# Patient Record
Sex: Male | Born: 1975
Health system: Southern US, Community
[De-identification: ages and names within clinical notes are randomized; demographics above are authoritative.]

## PROBLEM LIST (undated history)

## (undated) DIAGNOSIS — Q613 Polycystic kidney, unspecified: Secondary | ICD-10-CM

## (undated) DIAGNOSIS — I10 Essential (primary) hypertension: Secondary | ICD-10-CM

---

## 2016-07-14 ENCOUNTER — Ambulatory Visit: Payer: Self-pay | Admitting: Allergy

## 2016-07-15 ENCOUNTER — Other Ambulatory Visit: Payer: Self-pay

## 2016-07-15 ENCOUNTER — Emergency Department (HOSPITAL_COMMUNITY): Payer: BLUE CROSS/BLUE SHIELD

## 2016-07-15 ENCOUNTER — Emergency Department (HOSPITAL_COMMUNITY)
Admission: EM | Admit: 2016-07-15 | Discharge: 2016-07-15 | Disposition: A | Discharge status: Home / Self Care | Payer: BLUE CROSS/BLUE SHIELD | Attending: Emergency Medicine | Admitting: Emergency Medicine

## 2016-07-15 ENCOUNTER — Encounter (HOSPITAL_COMMUNITY): Payer: Self-pay | Admitting: Nurse Practitioner

## 2016-07-15 ENCOUNTER — Ambulatory Visit (HOSPITAL_COMMUNITY)
Admission: EM | Admit: 2016-07-15 | Discharge: 2016-07-15 | Discharge status: Absent without leave | Payer: BLUE CROSS/BLUE SHIELD

## 2016-07-15 DIAGNOSIS — R0789 Other chest pain: Secondary | ICD-10-CM | POA: Diagnosis present

## 2016-07-15 DIAGNOSIS — R519 Headache, unspecified: Secondary | ICD-10-CM

## 2016-07-15 DIAGNOSIS — R51 Headache: Secondary | ICD-10-CM | POA: Insufficient documentation

## 2016-07-15 DIAGNOSIS — I10 Essential (primary) hypertension: Secondary | ICD-10-CM | POA: Insufficient documentation

## 2016-07-15 DIAGNOSIS — R062 Wheezing: Secondary | ICD-10-CM

## 2016-07-15 HISTORY — DX: Polycystic kidney, unspecified: Q61.3

## 2016-07-15 HISTORY — DX: Essential (primary) hypertension: I10

## 2016-07-15 LAB — CBC
HEMATOCRIT: 43.8 % (ref 39.0–52.0)
HEMOGLOBIN: 15.5 g/dL (ref 13.0–17.0)
MCH: 30.8 pg (ref 26.0–34.0)
MCHC: 35.4 g/dL (ref 30.0–36.0)
MCV: 86.9 fL (ref 78.0–100.0)
Platelets: 176 10*3/uL (ref 150–400)
RBC: 5.04 MIL/uL (ref 4.22–5.81)
RDW: 12.1 % (ref 11.5–15.5)
WBC: 7.4 10*3/uL (ref 4.0–10.5)

## 2016-07-15 LAB — BASIC METABOLIC PANEL
ANION GAP: 10 (ref 5–15)
BUN: 12 mg/dL (ref 6–20)
CALCIUM: 10 mg/dL (ref 8.9–10.3)
CO2: 25 mmol/L (ref 22–32)
Chloride: 104 mmol/L (ref 101–111)
Creatinine, Ser: 0.97 mg/dL (ref 0.61–1.24)
GFR calc Af Amer: >60 mL/min (ref >60)
Glucose, Bld: 133 mg/dL — ABNORMAL HIGH (ref 65–99)
POTASSIUM: 3.4 mmol/L — AB (ref 3.5–5.1)
SODIUM: 139 mmol/L (ref 135–145)

## 2016-07-15 LAB — I-STAT TROPONIN, ED
TROPONIN I, POC: 0 ng/mL (ref 0.00–0.08)
TROPONIN I, POC: 0.01 ng/mL (ref 0.00–0.08)

## 2016-07-15 MED ORDER — GI COCKTAIL ~~LOC~~
30.0000 mL | Freq: Once | ORAL | Status: AC
  Administered 2016-07-15: 30 mL via ORAL
  Filled 2016-07-15: qty 30

## 2016-07-15 MED ORDER — AEROCHAMBER PLUS W/MASK MISC
Status: AC
  Administered 2016-07-15: 1
  Filled 2016-07-15: qty 1

## 2016-07-15 MED ORDER — ALBUTEROL SULFATE HFA 108 (90 BASE) MCG/ACT IN AERS
2.0000 | INHALATION_SPRAY | Freq: Once | RESPIRATORY_TRACT | Status: AC
  Administered 2016-07-15: 2 via RESPIRATORY_TRACT
  Filled 2016-07-15: qty 6.7

## 2016-07-15 MED ORDER — KETOROLAC TROMETHAMINE 60 MG/2ML IM SOLN
60.0000 mg | Freq: Once | INTRAMUSCULAR | Status: AC
  Administered 2016-07-15: 60 mg via INTRAMUSCULAR
  Filled 2016-07-15: qty 2

## 2016-07-15 MED ORDER — OMEPRAZOLE 20 MG PO CPDR: 20.0000 mg | DELAYED_RELEASE_CAPSULE | Freq: Every day | ORAL | 0 refills | Status: AC

## 2016-07-15 MED ORDER — ALBUTEROL SULFATE (2.5 MG/3ML) 0.083% IN NEBU
5.0000 mg | INHALATION_SOLUTION | Freq: Once | RESPIRATORY_TRACT | Status: AC
  Administered 2016-07-15: 5 mg via RESPIRATORY_TRACT
  Filled 2016-07-15: qty 6

## 2016-07-15 MED ORDER — PREDNISONE 20 MG PO TABS
60.0000 mg | ORAL_TABLET | Freq: Once | ORAL | Status: AC
  Administered 2016-07-15: 60 mg via ORAL
  Filled 2016-07-15: qty 3

## 2016-07-15 MED ORDER — AEROCHAMBER PLUS W/MASK MISC
1.0000 | Freq: Once | Status: AC
  Administered 2016-07-15: 1

## 2016-07-15 MED ORDER — METOCLOPRAMIDE HCL 10 MG PO TABS
10.0000 mg | ORAL_TABLET | Freq: Once | ORAL | Status: AC
  Administered 2016-07-15: 10 mg via ORAL
  Filled 2016-07-15: qty 1

## 2016-07-15 MED ORDER — ENALAPRIL MALEATE 10 MG PO TABS: 10.0000 mg | ORAL_TABLET | Freq: Every day | ORAL | 1 refills | Status: AC

## 2016-07-15 MED ORDER — DIPHENHYDRAMINE HCL 25 MG PO CAPS
25.0000 mg | ORAL_CAPSULE | Freq: Once | ORAL | Status: AC
  Administered 2016-07-15: 25 mg via ORAL
  Filled 2016-07-15: qty 1

## 2016-07-15 MED ORDER — IPRATROPIUM BROMIDE 0.02 % IN SOLN
0.5000 mg | Freq: Once | RESPIRATORY_TRACT | Status: AC
  Administered 2016-07-15: 0.5 mg via RESPIRATORY_TRACT
  Filled 2016-07-15: qty 2.5

## 2016-07-15 NOTE — ED Provider Notes (Signed)
[facility] Provider Note   CSN: [id number] Arrival date & time: 07/15/16  [id number]     History   Chief Complaint Chief Complaint  Patient presents with  . Chest Pain    HPI Gabriel Soto is a 40 y.o. male.  HPI   Patient is a 40 year old AAM, with PMHx of PKD and HTN, [place] the [place]) presents to the ER for evaluation of central and left-sided chest pressure that began at 5:30 PM, patient states he was walking out of the classroom when the pain began and was associated with shortness of breath.  Pain is without radiation, last 3-5 minutes at a time and recurs every 20 minutes since its onset, all sx completely resolve in between episodes and currently he reports no CP.  He had similar chest pain about 4 days ago after vomiting all day long, he was [facility] in the [facility] for this. Since that time he reports mild right posterior headache described as burning.  He states that his chest pain is similar.  He denies palpitations, lower extremity edema, near syncope, cough, fever, wheeze.  Denies recent URI symptoms.  He reports childhood asthma.  No smoking history, he denies family history of heart attack or stroke.  Past Medical History:  Diagnosis Date  . Hypertension   . Polycystic kidney disease     There are no active problems to display for this patient.   History reviewed. No pertinent surgical history.     Home Medications    Prior to Admission medications   Medication Sig Start Date End Date Taking? Authorizing Provider  enalapril (VASOTEC) 10 MG tablet Take 10 mg by mouth at bedtime.   Yes Historical Provider, MD  enalapril (VASOTEC) 10 MG tablet Take 1 tablet (10 mg total) by mouth daily. 07/15/16   Danelle BerryLeisa Manju Kulkarni, PA-C  omeprazole (PRILOSEC) 20 MG capsule Take 1 capsule (20 mg total) by mouth daily. 07/15/16   Danelle BerryLeisa Amatullah Christy, PA-C    Family History History reviewed. No pertinent family history.  Social  History Social History  Substance Use Topics  . Smoking status: Never Smoker  . Smokeless tobacco: Never Used  . Alcohol use No     Allergies   Shrimp [shellfish allergy]   Review of Systems Review of Systems  All other systems reviewed and are negative.    Physical Exam Updated Vital Signs BP 156/82   Pulse 68   Temp 98.3 F (36.8 C) (Oral)   Resp 18   Ht 5' 9.69" (1.77 m)   Wt 65 kg   SpO2 97%   BMI 20.75 kg/m   Physical Exam  Constitutional: He is oriented to person, place, and time. He appears well-developed and well-nourished. No distress.  Thin well appearing male, appears stated age, NAD  HENT:  Head: Normocephalic and atraumatic.  Right Ear: External ear normal.  Left Ear: External ear normal.  Nose: Nose normal.  Mouth/Throat: Oropharynx is clear and moist. No oropharyngeal exudate.  Eyes: Conjunctivae and EOM are normal. Pupils are equal, round, and reactive to light. Right eye exhibits no discharge. Left eye exhibits no discharge. No scleral icterus.  Neck: Normal range of motion. Neck supple. No JVD present. No tracheal deviation present.  Cardiovascular: Normal rate, regular rhythm, normal heart sounds and intact distal pulses.  Exam reveals no gallop and no friction rub.   No murmur heard. Symmetrical radial and posterior tibialis pulses, 2+, no lower extremity edema, no  JVD  Pulmonary/Chest: Effort normal. No stridor. No respiratory distress. He has wheezes. He has no rales. He exhibits no tenderness.  Expiratory wheeze, rhonchi bilaterally in mid to lower lung fields, no respiratory distress, no accessory muscle use  Abdominal: Soft. Bowel sounds are normal. He exhibits no distension and no mass. There is no tenderness. There is no rebound and no guarding.  Musculoskeletal: Normal range of motion. He exhibits no deformity.  Lymphadenopathy:    He has no cervical adenopathy.  Neurological: He is alert and oriented to person, place, and time. He has  normal reflexes. He exhibits normal muscle tone. Coordination normal.  Skin: Skin is warm and dry. Capillary refill takes less than 2 seconds. No rash noted. He is not diaphoretic. No erythema. No pallor.  Psychiatric: He has a normal mood and affect. His behavior is normal. Judgment and thought content normal.  Nursing note and vitals reviewed.    ED Treatments / Results  Labs (all labs ordered are listed, but only abnormal results are displayed) Labs Reviewed  BASIC METABOLIC PANEL - Abnormal; Notable for the following:       Result Value   Potassium 3.4 (*)    Glucose, Bld 133 (*)    All other components within normal limits  CBC  I-STAT TROPOININ, ED  I-STAT TROPOININ, ED    EKG  EKG Interpretation None       Radiology Dg Chest 2 View  Result Date: 07/15/2016 CLINICAL DATA:  Left-sided chest pain and shortness of breath EXAM: CHEST  2 VIEW COMPARISON:  None. FINDINGS: The heart size and mediastinal contours are within normal limits. Both lungs are clear. The visualized skeletal structures are unremarkable. IMPRESSION: No active cardiopulmonary disease. Electronically Signed   By: Gerome Samavid  Williams III M.D   On: 07/15/2016 19:50    Procedures Procedures (including critical care time)  Medications Ordered in ED Medications  gi cocktail (Maalox,Lidocaine,Donnatal) (30 mLs Oral Given 07/15/16 2130)  predniSONE (DELTASONE) tablet 60 mg (60 mg Oral Given 07/15/16 2127)  ketorolac (TORADOL) injection 60 mg (60 mg Intramuscular Given 07/15/16 2135)  metoCLOPramide (REGLAN) tablet 10 mg (10 mg Oral Given 07/15/16 2126)  diphenhydrAMINE (BENADRYL) capsule 25 mg (25 mg Oral Given 07/15/16 2126)  albuterol (PROVENTIL) (2.5 MG/3ML) 0.083% nebulizer solution 5 mg (5 mg Nebulization Given 07/15/16 2124)  ipratropium (ATROVENT) nebulizer solution 0.5 mg (0.5 mg Nebulization Given 07/15/16 2124)  albuterol (PROVENTIL HFA;VENTOLIN HFA) 108 (90 Base) MCG/ACT inhaler 2 puff (2 puffs  Inhalation Given 07/15/16 2226)  aerochamber plus with mask device 1 each (1 each Other Given 07/15/16 2226)     Initial Impression / Assessment and Plan / ED Course  I have reviewed the triage vital signs and the nursing notes.  Pertinent labs & imaging results that were available during my care of the patient were reviewed by me and considered in my medical decision making (see chart for details).  Clinical Course   Patient with chest pressure/tightness that began this afternoon, lasts a few minutes, associated with SOB, resolves for 20-30 minutes and then reoccurs.  He had 0 pain at the time of my evaluation but did have scattered rhonchi and expiratory wheeze.  No other associated symptoms with his pain today. He did report having profuse vomiting several days ago when he had chest discomfort at that time, it felt somewhat similar today however he had no vomiting or reflux symptoms today.  He also reports a history of asthma but he has not used an  inhaler since high school.  He is concerned that his blood pressure is elevated today he takes enalapril for hypertension and he has polycystic kidney disease, has been in the [place] for 3 months and has not established primary care provider.    His workup was negative, including negative troponin, negative chest x-ray, EKG normal sinus with likely LVH, no ischemic changes.  He is given a GI cocktail and breathing treatment with clearance of his wheeze.  He continued to have no pain and was hemodynamically stable, is due for his blood pressure medication.  Delta troponin was negative.   Discharge home with albuterol inhaler to use as needed for wheeze or chest tightness. Initiated PPI trial.  Pain was treated in the ER, headache was completely resolved, and he had no reoccurrence of chest pain.  Patient does have insurance but will need to establish primary care provider for follow-up on this chronic conditions. His pressure medication was refilled for him.   Given resources for assistance with establishing PCP.  Final Clinical Impressions(s) / ED Diagnoses   Final diagnoses:  Atypical chest pain  Nonintractable headache, unspecified chronicity pattern, unspecified headache type  Wheeze    New Prescriptions Discharge Medication List as of 07/15/2016 10:02 PM    START taking these medications   Details  !! enalapril (VASOTEC) 10 MG tablet Take 1 tablet (10 mg total) by mouth daily., Starting Fri 07/15/2016, Print    omeprazole (PRILOSEC) 20 MG capsule Take 1 capsule (20 mg total) by mouth daily., Starting Fri 07/15/2016, Print     !! - Potential duplicate medications found. Please discuss with provider.       Danelle Berry, PA-C 07/16/16 1610    Gwyneth Sprout, MD 07/19/16 2028

## 2016-07-15 NOTE — ED Triage Notes (Signed)
Pt presents with c/o chest pain. The pain began this afternoon while he was on his way home from class. He describes as a tightness in the middle of his chest. The pain has been intermittent since onset. He reports SOB, headache. He reports a recent GI bug that he was treated for at student clinic and has not felt well since.

## 2016-07-15 NOTE — ED Notes (Addendum)
Pt started to have pain in his chest at 1730 this date. Pain immediately subsided but states he began to have a headache that won't go away. Denies any N/V/D or SOB.

## 2016-07-15 NOTE — ED Notes (Signed)
"  Breathing easier, feeling better", alert, NAD, calm, interactive, resps e/u, LS CTA, no dyspnea noted, (denies: sx or pain.

## 2016-07-15 NOTE — ED Notes (Signed)
Pt vebalized understanding and teach back method of using the inhaler and chamber.

## 2016-07-15 NOTE — ED Notes (Signed)
Neb treatment completed.

## 2019-09-09 ENCOUNTER — Other Ambulatory Visit: Payer: Self-pay

## 2019-09-09 ENCOUNTER — Emergency Department (HOSPITAL_COMMUNITY)
Admission: EM | Admit: 2019-09-09 | Discharge: 2019-09-09 | Discharge status: Against Medical Advice | Payer: BC Managed Care – PPO

## 2019-09-09 DIAGNOSIS — Z1159 Encounter for screening for other viral diseases: Secondary | ICD-10-CM | POA: Diagnosis not present

## 2019-09-09 NOTE — ED Notes (Signed)
Pt called for triage x3 

## 2019-09-09 NOTE — ED Notes (Signed)
Pt called for triage x2 

## 2019-09-09 NOTE — ED Notes (Signed)
Called for triage x1, no answer 

## 2019-09-13 DIAGNOSIS — Z0001 Encounter for general adult medical examination with abnormal findings: Secondary | ICD-10-CM | POA: Diagnosis not present

## 2019-09-17 DIAGNOSIS — E781 Pure hyperglyceridemia: Secondary | ICD-10-CM | POA: Diagnosis not present

## 2019-09-21 DIAGNOSIS — E781 Pure hyperglyceridemia: Secondary | ICD-10-CM | POA: Diagnosis not present

## 2019-09-25 DIAGNOSIS — I10 Essential (primary) hypertension: Secondary | ICD-10-CM | POA: Diagnosis not present

## 2019-09-25 DIAGNOSIS — E119 Type 2 diabetes mellitus without complications: Secondary | ICD-10-CM | POA: Diagnosis not present

## 2019-09-30 DIAGNOSIS — Z20828 Contact with and (suspected) exposure to other viral communicable diseases: Secondary | ICD-10-CM | POA: Diagnosis not present

## 2019-10-09 DIAGNOSIS — I10 Essential (primary) hypertension: Secondary | ICD-10-CM | POA: Diagnosis not present

## 2019-10-09 DIAGNOSIS — E119 Type 2 diabetes mellitus without complications: Secondary | ICD-10-CM | POA: Diagnosis not present

## 2019-10-09 DIAGNOSIS — Q613 Polycystic kidney, unspecified: Secondary | ICD-10-CM | POA: Diagnosis not present

## 2019-10-31 DIAGNOSIS — I10 Essential (primary) hypertension: Secondary | ICD-10-CM | POA: Diagnosis not present

## 2019-10-31 DIAGNOSIS — E119 Type 2 diabetes mellitus without complications: Secondary | ICD-10-CM | POA: Diagnosis not present

## 2019-10-31 DIAGNOSIS — Q613 Polycystic kidney, unspecified: Secondary | ICD-10-CM | POA: Diagnosis not present

## 2019-11-07 DIAGNOSIS — Q613 Polycystic kidney, unspecified: Secondary | ICD-10-CM | POA: Diagnosis not present

## 2019-11-07 DIAGNOSIS — I129 Hypertensive chronic kidney disease with stage 1 through stage 4 chronic kidney disease, or unspecified chronic kidney disease: Secondary | ICD-10-CM | POA: Diagnosis not present

## 2019-11-07 DIAGNOSIS — E1122 Type 2 diabetes mellitus with diabetic chronic kidney disease: Secondary | ICD-10-CM | POA: Diagnosis not present

## 2019-11-11 ENCOUNTER — Other Ambulatory Visit: Payer: Self-pay | Admitting: Nephrology

## 2019-11-11 DIAGNOSIS — Q613 Polycystic kidney, unspecified: Secondary | ICD-10-CM

## 2019-11-13 ENCOUNTER — Ambulatory Visit
Admission: RE | Admit: 2019-11-13 | Discharge: 2019-11-13 | Disposition: A | Discharge status: Home / Self Care | Payer: BC Managed Care – PPO | Source: Ambulatory Visit | Attending: Nephrology | Admitting: Nephrology

## 2019-11-13 DIAGNOSIS — N1330 Unspecified hydronephrosis: Secondary | ICD-10-CM | POA: Diagnosis not present

## 2019-11-13 DIAGNOSIS — Q613 Polycystic kidney, unspecified: Secondary | ICD-10-CM

## 2019-11-14 ENCOUNTER — Ambulatory Visit: Payer: BC Managed Care – PPO | Attending: Family

## 2019-11-14 DIAGNOSIS — Z23 Encounter for immunization: Secondary | ICD-10-CM

## 2019-11-14 NOTE — Progress Notes (Signed)
   Covid-19 Vaccination Clinic  Name:  Etienne Mowers    MRN: [id number] DOB: Jul 27, 1976  11/14/2019  Mr. Burkitt was observed post Covid-19 immunization for 15 minutes without incident. He was provided with Vaccine Information Sheet and instruction to access the V-Safe system.   Mr. Kepner was instructed to call 911 with any severe reactions post vaccine: Marland Kitchen Difficulty breathing  . Swelling of face and throat  . A fast heartbeat  . A bad rash all over body  . Dizziness and weakness   Immunizations Administered    Name Date Dose VIS Date Route   Moderna COVID-19 Vaccine 11/14/2019 11:36 AM 0.5 mL 08/06/2019 Intramuscular   Manufacturer: Moderna   Lot: [id number]   NDC: 60677-034-03

## 2019-11-26 DIAGNOSIS — Z833 Family history of diabetes mellitus: Secondary | ICD-10-CM | POA: Diagnosis not present

## 2019-11-26 DIAGNOSIS — Z0001 Encounter for general adult medical examination with abnormal findings: Secondary | ICD-10-CM | POA: Diagnosis not present

## 2019-12-17 ENCOUNTER — Ambulatory Visit: Payer: BC Managed Care – PPO | Attending: Family

## 2019-12-17 DIAGNOSIS — Z23 Encounter for immunization: Secondary | ICD-10-CM

## 2019-12-17 NOTE — Progress Notes (Signed)
   Covid-19 Vaccination Clinic  Name:  Gabriel Soto    MRN: [id number] DOB: April 17, 1976  12/17/2019  Mr. Gabriel Soto was observed post Covid-19 immunization for 15 minutes without incident. He was provided with Vaccine Information Sheet and instruction to access the V-Safe system.   Mr. Gabriel Soto was instructed to call 911 with any severe reactions post vaccine: Marland Kitchen Difficulty breathing  . Swelling of face and throat  . A fast heartbeat  . A bad rash all over body  . Dizziness and weakness   Immunizations Administered    Name Date Dose VIS Date Route   Moderna COVID-19 Vaccine 12/17/2019 11:34 AM 0.5 mL 08/06/2019 Intramuscular   Manufacturer: Moderna   Lot: [id number]   NDC: 47125-271-29

## 2019-12-19 DIAGNOSIS — Q613 Polycystic kidney, unspecified: Secondary | ICD-10-CM | POA: Diagnosis not present

## 2019-12-19 DIAGNOSIS — E1122 Type 2 diabetes mellitus with diabetic chronic kidney disease: Secondary | ICD-10-CM | POA: Diagnosis not present

## 2019-12-19 DIAGNOSIS — I129 Hypertensive chronic kidney disease with stage 1 through stage 4 chronic kidney disease, or unspecified chronic kidney disease: Secondary | ICD-10-CM | POA: Diagnosis not present

## 2020-01-02 DIAGNOSIS — Q613 Polycystic kidney, unspecified: Secondary | ICD-10-CM | POA: Diagnosis not present

## 2020-01-02 DIAGNOSIS — I10 Essential (primary) hypertension: Secondary | ICD-10-CM | POA: Diagnosis not present

## 2020-01-02 DIAGNOSIS — E119 Type 2 diabetes mellitus without complications: Secondary | ICD-10-CM | POA: Diagnosis not present

## 2020-02-14 DIAGNOSIS — E782 Mixed hyperlipidemia: Secondary | ICD-10-CM | POA: Diagnosis not present

## 2020-02-14 DIAGNOSIS — Q613 Polycystic kidney, unspecified: Secondary | ICD-10-CM | POA: Diagnosis not present

## 2020-02-14 DIAGNOSIS — I151 Hypertension secondary to other renal disorders: Secondary | ICD-10-CM | POA: Diagnosis not present

## 2020-05-08 DIAGNOSIS — H6980 Other specified disorders of Eustachian tube, unspecified ear: Secondary | ICD-10-CM | POA: Diagnosis not present

## 2020-05-08 DIAGNOSIS — H9390 Unspecified disorder of ear, unspecified ear: Secondary | ICD-10-CM | POA: Diagnosis not present

## 2020-05-15 DIAGNOSIS — H6123 Impacted cerumen, bilateral: Secondary | ICD-10-CM | POA: Diagnosis not present

## 2020-07-02 DIAGNOSIS — Z23 Encounter for immunization: Secondary | ICD-10-CM | POA: Diagnosis not present

## 2020-07-02 DIAGNOSIS — I10 Essential (primary) hypertension: Secondary | ICD-10-CM | POA: Diagnosis not present

## 2020-07-17 ENCOUNTER — Ambulatory Visit: Payer: BC Managed Care – PPO | Attending: Family

## 2020-07-17 DIAGNOSIS — Z23 Encounter for immunization: Secondary | ICD-10-CM

## 2020-08-24 DIAGNOSIS — I10 Essential (primary) hypertension: Secondary | ICD-10-CM | POA: Diagnosis not present

## 2020-10-15 ENCOUNTER — Other Ambulatory Visit: Payer: BC Managed Care – PPO

## 2020-10-15 DIAGNOSIS — Z20822 Contact with and (suspected) exposure to covid-19: Secondary | ICD-10-CM | POA: Diagnosis not present

## 2020-10-15 NOTE — Progress Notes (Signed)
   Covid-19 Vaccination Clinic  Name:  Moosa Bueche    MRN: [id number] DOB: [birth year]  10/15/2020  Mr. Wadlow was observed post Covid-19 immunization for 15 minutes without incident. He was provided with Vaccine Information Sheet and instruction to access the V-Safe system.   Mr. Saxer was instructed to call 911 with any severe reactions post vaccine: Marland Kitchen Difficulty breathing  . Swelling of face and throat  . A fast heartbeat  . A bad rash all over body  . Dizziness and weakness   Immunizations Administered    Name Date Dose VIS Date Route   Moderna Covid-19 Booster Vaccine 07/17/2020 11:10 AM 0.25 mL 06/24/2020 Intramuscular   Manufacturer: Moderna   Lot: [id number]   NDC: 00762-263-33

## 2020-10-16 LAB — NOVEL CORONAVIRUS, NAA: SARS-CoV-2, NAA: NOT DETECTED

## 2020-10-16 LAB — SARS-COV-2, NAA 2 DAY TAT

## 2020-11-05 DIAGNOSIS — Z20822 Contact with and (suspected) exposure to covid-19: Secondary | ICD-10-CM | POA: Diagnosis not present

## 2021-01-05 DIAGNOSIS — I10 Essential (primary) hypertension: Secondary | ICD-10-CM | POA: Diagnosis not present

## 2021-01-06 DIAGNOSIS — Z0001 Encounter for general adult medical examination with abnormal findings: Secondary | ICD-10-CM | POA: Diagnosis not present

## 2021-04-08 DIAGNOSIS — Q613 Polycystic kidney, unspecified: Secondary | ICD-10-CM | POA: Diagnosis not present

## 2021-04-08 DIAGNOSIS — E119 Type 2 diabetes mellitus without complications: Secondary | ICD-10-CM | POA: Diagnosis not present

## 2021-04-08 DIAGNOSIS — I10 Essential (primary) hypertension: Secondary | ICD-10-CM | POA: Diagnosis not present

## 2021-04-08 DIAGNOSIS — J45909 Unspecified asthma, uncomplicated: Secondary | ICD-10-CM | POA: Diagnosis not present

## 2021-05-17 DIAGNOSIS — I129 Hypertensive chronic kidney disease with stage 1 through stage 4 chronic kidney disease, or unspecified chronic kidney disease: Secondary | ICD-10-CM | POA: Diagnosis not present

## 2021-05-17 DIAGNOSIS — Z7984 Long term (current) use of oral hypoglycemic drugs: Secondary | ICD-10-CM | POA: Diagnosis not present

## 2021-05-17 DIAGNOSIS — E1122 Type 2 diabetes mellitus with diabetic chronic kidney disease: Secondary | ICD-10-CM | POA: Diagnosis not present

## 2021-05-17 DIAGNOSIS — Q613 Polycystic kidney, unspecified: Secondary | ICD-10-CM | POA: Diagnosis not present

## 2021-05-17 DIAGNOSIS — N182 Chronic kidney disease, stage 2 (mild): Secondary | ICD-10-CM | POA: Diagnosis not present

## 2021-06-04 DIAGNOSIS — Q613 Polycystic kidney, unspecified: Secondary | ICD-10-CM | POA: Diagnosis not present

## 2021-06-04 DIAGNOSIS — K7689 Other specified diseases of liver: Secondary | ICD-10-CM | POA: Diagnosis not present

## 2021-07-19 DIAGNOSIS — E119 Type 2 diabetes mellitus without complications: Secondary | ICD-10-CM | POA: Diagnosis not present

## 2021-07-19 DIAGNOSIS — E782 Mixed hyperlipidemia: Secondary | ICD-10-CM | POA: Diagnosis not present

## 2021-07-19 DIAGNOSIS — Z23 Encounter for immunization: Secondary | ICD-10-CM | POA: Diagnosis not present

## 2021-07-19 DIAGNOSIS — R972 Elevated prostate specific antigen [PSA]: Secondary | ICD-10-CM | POA: Diagnosis not present

## 2021-07-19 DIAGNOSIS — I10 Essential (primary) hypertension: Secondary | ICD-10-CM | POA: Diagnosis not present

## 2021-09-15 DIAGNOSIS — I129 Hypertensive chronic kidney disease with stage 1 through stage 4 chronic kidney disease, or unspecified chronic kidney disease: Secondary | ICD-10-CM | POA: Diagnosis not present

## 2021-09-15 DIAGNOSIS — Q613 Polycystic kidney, unspecified: Secondary | ICD-10-CM | POA: Diagnosis not present

## 2021-09-15 DIAGNOSIS — N182 Chronic kidney disease, stage 2 (mild): Secondary | ICD-10-CM | POA: Diagnosis not present

## 2021-10-18 ENCOUNTER — Ambulatory Visit: Payer: BC Managed Care – PPO | Attending: Family

## 2021-10-18 DIAGNOSIS — Z23 Encounter for immunization: Secondary | ICD-10-CM

## 2021-10-18 NOTE — Progress Notes (Signed)
° °  Covid-19 Vaccination Clinic  Name:  Gabriel Soto    MRN: [id number] DOB: [birth year]  10/18/2021  Gabriel Soto was observed post Covid-19 immunization for 15 minutes without incident. He was provided with Vaccine Information Sheet and instruction to access the V-Safe system.   Gabriel Soto was instructed to call 911 with any severe reactions post vaccine: Difficulty breathing  Swelling of face and throat  A fast heartbeat  A bad rash all over body  Dizziness and weakness   Immunizations Administered     Name Date Dose VIS Date Route   Moderna Covid-19 vaccine Bivalent Booster 10/18/2021 11:30 AM 0.5 mL 04/17/2021 Intramuscular   Manufacturer: Moderna   Lot: [id number]   [place][id number]

## 2021-11-23 IMAGING — US US RENAL
1 series · 14 of 25 positions shown · non-contrast
Comparison: None.

CLINICAL DATA: History of polycystic kidney disease

EXAM:
RENAL / URINARY TRACT ULTRASOUND COMPLETE

[Series 1: us renal · 0.23mm/px · 14 of 90 slices shown]
[im 1/90]
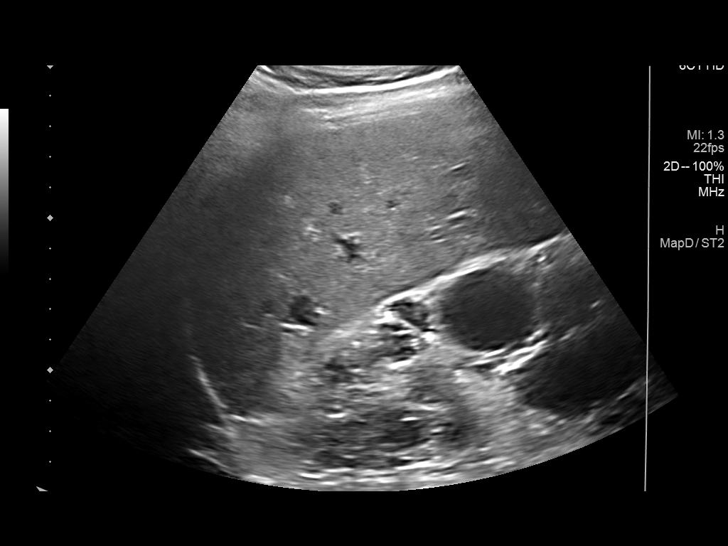
[im 8/90]
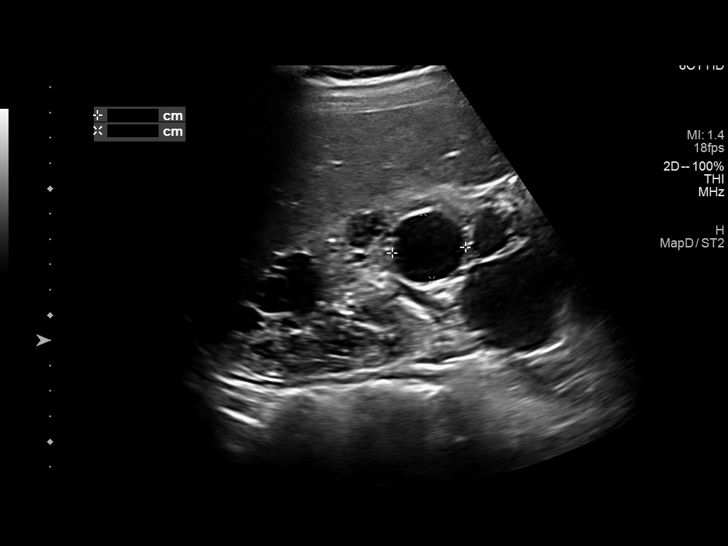
[im 15/90]
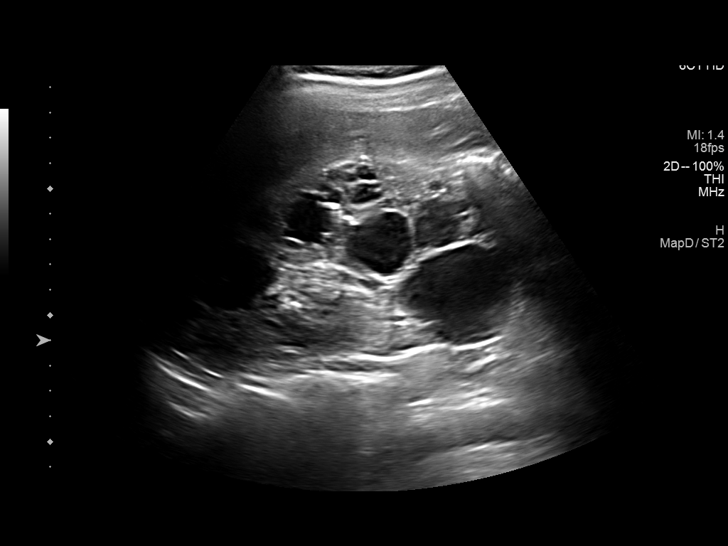
[im 23/90]
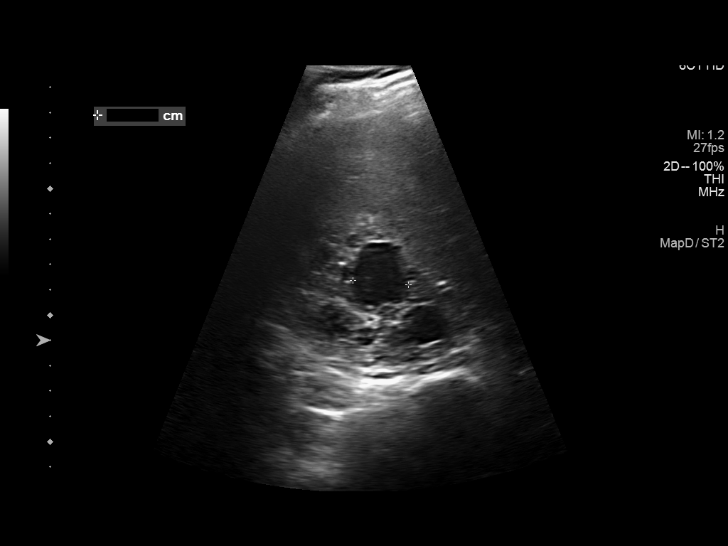
[im 30/90]
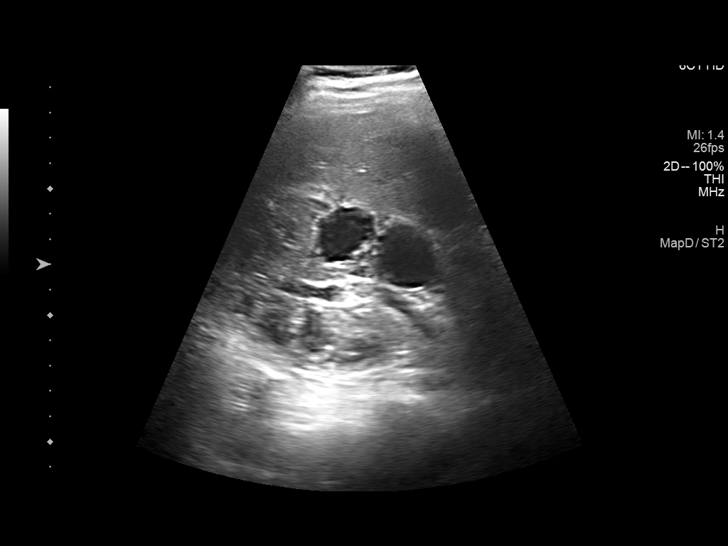
[im 34/90]
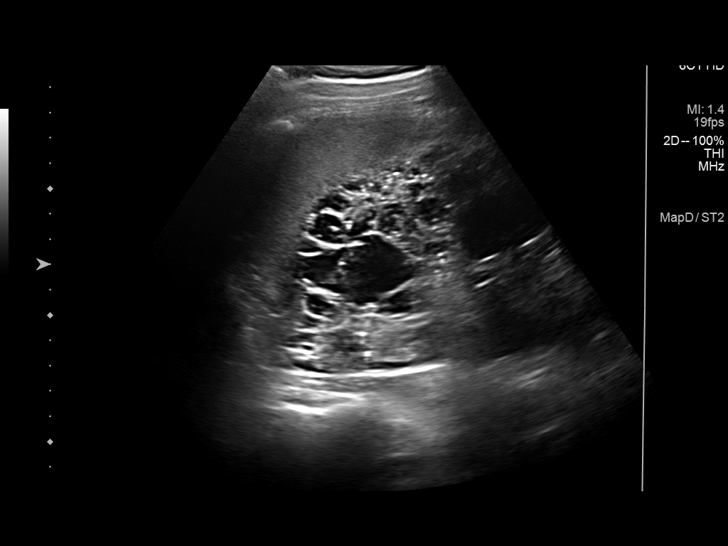
[im 41/90]
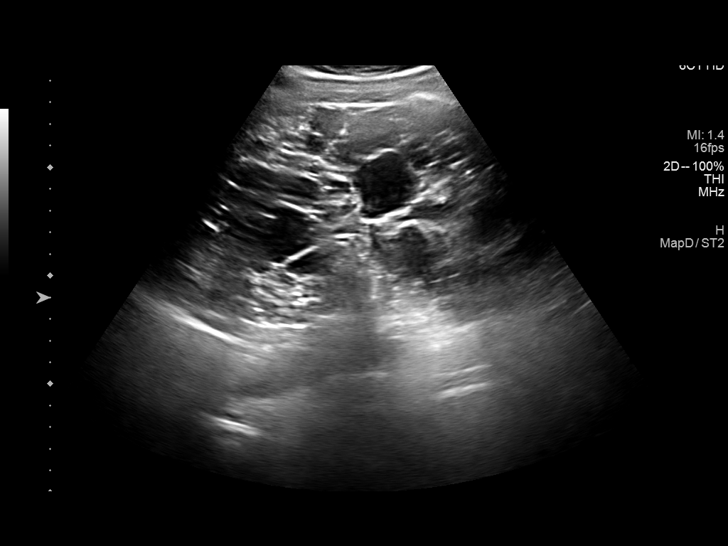
[im 49/90]
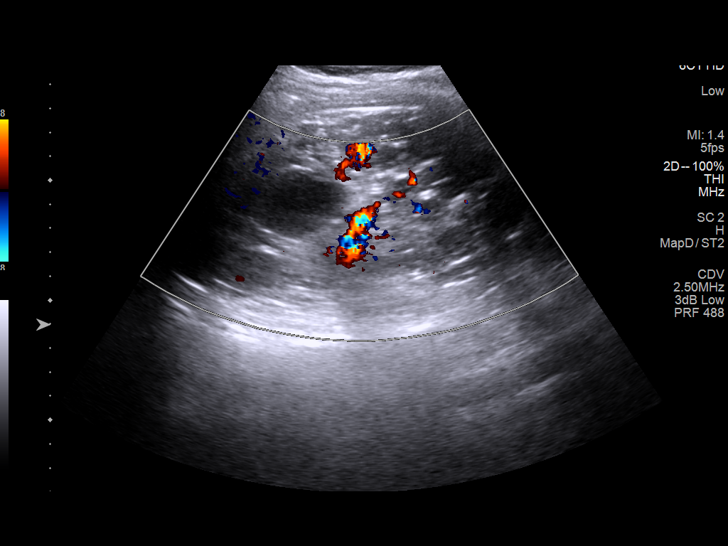
[im 56/90]
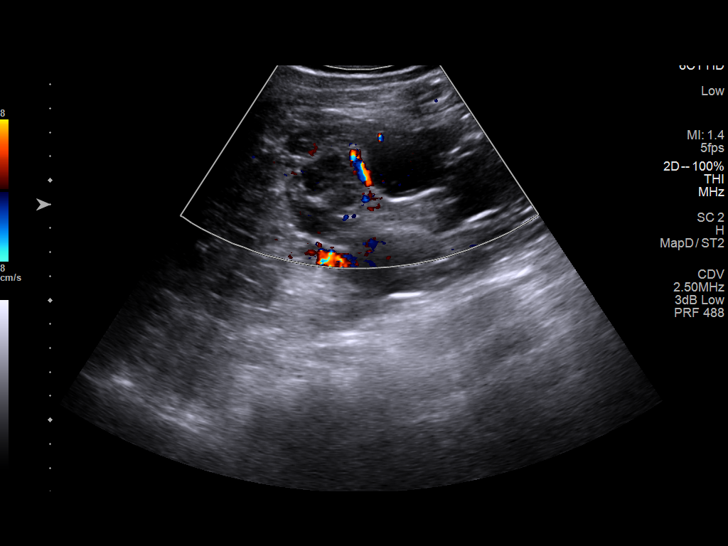
[im 60/90]
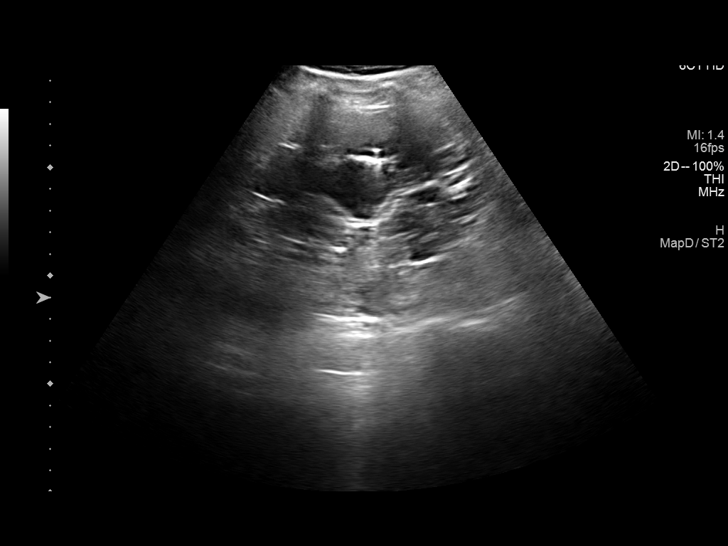
[im 67/90]
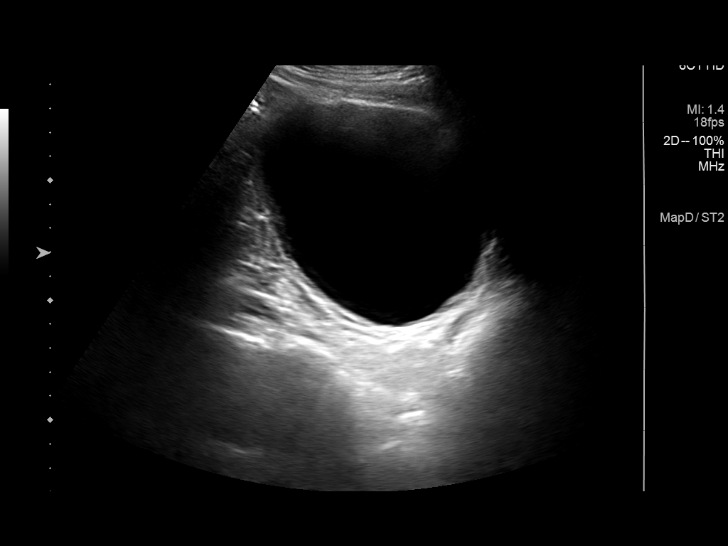
[im 75/90]
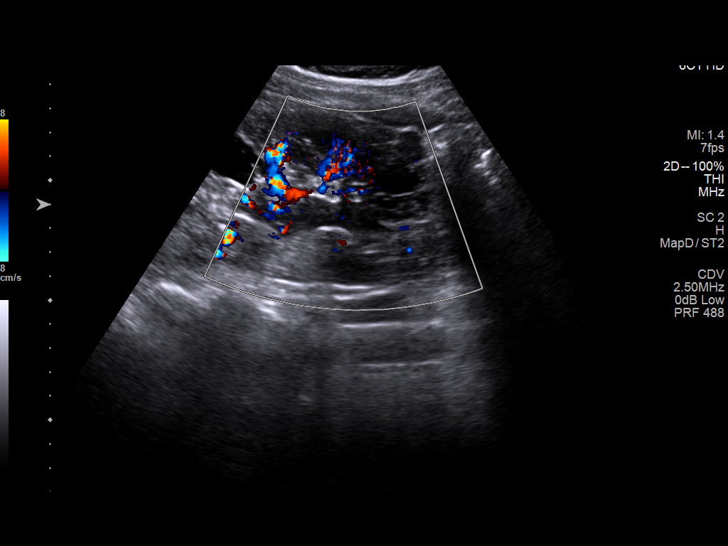
[im 82/90]
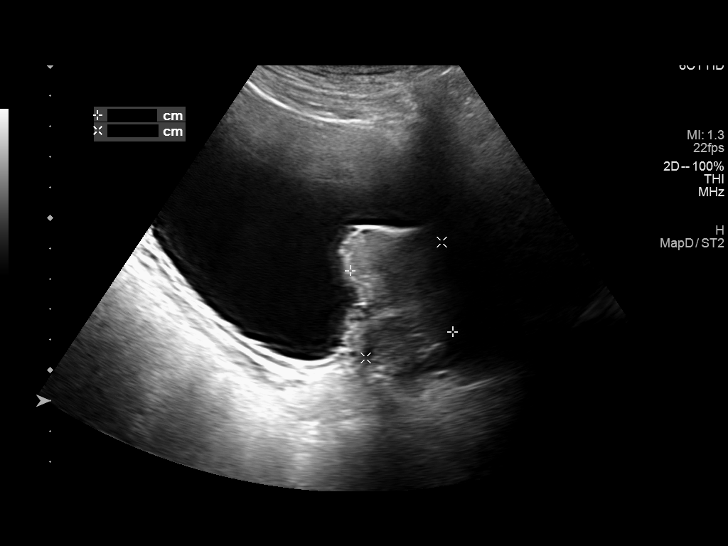
[im 90/90]
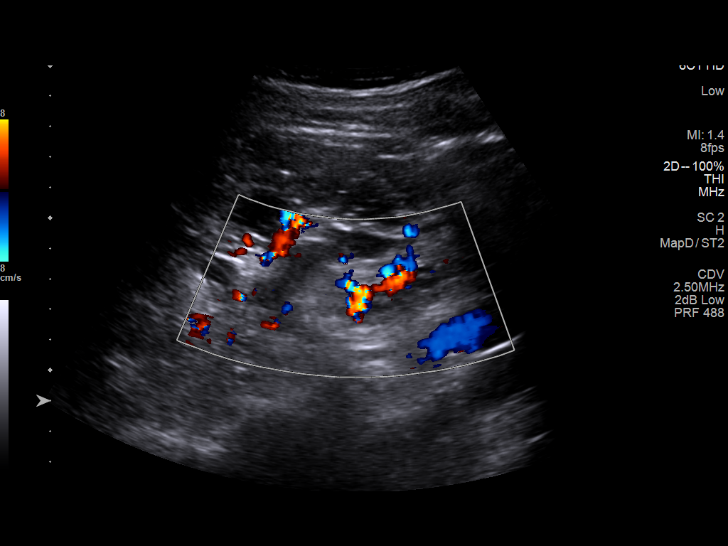

[14 of 25 positions shown; findings below may reference images not displayed]

FINDINGS: Right Kidney:

Renal measurements: 17.9 x 7.2 x 7.9 cm = volume: 530.3 mL. Numerous
cysts both simple and complex with some cysts containing internal
echoes. The largest cysts were measured. 2.9 x 2.7 x 2.4 cm midpole
cyst. Lower pole cyst measuring 4.4 x 4.1 by 3.5 cm. Mild
hydronephrosis

Left Kidney:

Renal measurements: 19 x 10.7 x 11 cm = volume: 1166.8 mL. Numerous
cysts, both simple and slightly complex with scattered internal
echoes. No hydronephrosis. The largest 2 cysts were measured. These
are visible at the mid pole measuring 4.6 x 2.5 x 3.7 cm and at the
upper pole measuring 4 x 2.5 by 3.7 cm.

Bladder:

Appears normal for degree of bladder distention.

Other:

Lobulated soft tissue mass the bladder base likely reflects enlarged
prostate
IMPRESSION: 1. Enlarged polycystic kidneys. Some of the cysts contain internal
echoes suggesting complication by hemorrhage or proteinaceous
debris. No highly suspicious solid mass is seen within either
kidney.
2. Mild right hydronephrosis
3. Enlarged prostate

## 2021-11-24 DIAGNOSIS — H5203 Hypermetropia, bilateral: Secondary | ICD-10-CM | POA: Diagnosis not present

## 2021-12-06 DIAGNOSIS — R972 Elevated prostate specific antigen [PSA]: Secondary | ICD-10-CM | POA: Diagnosis not present

## 2021-12-06 DIAGNOSIS — Q613 Polycystic kidney, unspecified: Secondary | ICD-10-CM | POA: Diagnosis not present

## 2021-12-06 DIAGNOSIS — E119 Type 2 diabetes mellitus without complications: Secondary | ICD-10-CM | POA: Diagnosis not present

## 2021-12-06 DIAGNOSIS — E782 Mixed hyperlipidemia: Secondary | ICD-10-CM | POA: Diagnosis not present

## 2021-12-06 DIAGNOSIS — I10 Essential (primary) hypertension: Secondary | ICD-10-CM | POA: Diagnosis not present

## 2021-12-15 DIAGNOSIS — R972 Elevated prostate specific antigen [PSA]: Secondary | ICD-10-CM | POA: Diagnosis not present

## 2021-12-15 DIAGNOSIS — I129 Hypertensive chronic kidney disease with stage 1 through stage 4 chronic kidney disease, or unspecified chronic kidney disease: Secondary | ICD-10-CM | POA: Diagnosis not present

## 2021-12-15 DIAGNOSIS — Z79899 Other long term (current) drug therapy: Secondary | ICD-10-CM | POA: Diagnosis not present

## 2021-12-15 DIAGNOSIS — N182 Chronic kidney disease, stage 2 (mild): Secondary | ICD-10-CM | POA: Diagnosis not present

## 2021-12-15 DIAGNOSIS — Q613 Polycystic kidney, unspecified: Secondary | ICD-10-CM | POA: Diagnosis not present

## 2021-12-23 DIAGNOSIS — R972 Elevated prostate specific antigen [PSA]: Secondary | ICD-10-CM | POA: Diagnosis not present

## 2021-12-23 DIAGNOSIS — N401 Enlarged prostate with lower urinary tract symptoms: Secondary | ICD-10-CM | POA: Diagnosis not present

## 2021-12-23 DIAGNOSIS — R351 Nocturia: Secondary | ICD-10-CM | POA: Diagnosis not present

## 2021-12-27 ENCOUNTER — Other Ambulatory Visit: Payer: Self-pay | Admitting: Urology

## 2021-12-27 DIAGNOSIS — R972 Elevated prostate specific antigen [PSA]: Secondary | ICD-10-CM

## 2022-01-17 ENCOUNTER — Ambulatory Visit
Admission: RE | Admit: 2022-01-17 | Discharge: 2022-01-17 | Disposition: A | Discharge status: Home / Self Care | Payer: BC Managed Care – PPO | Source: Ambulatory Visit | Attending: Urology | Admitting: Urology

## 2022-01-17 DIAGNOSIS — R972 Elevated prostate specific antigen [PSA]: Secondary | ICD-10-CM | POA: Diagnosis not present

## 2022-01-17 MED ORDER — GADOPICLENOL 0.5 MMOL/ML IV SOLN
7.0000 mL | Freq: Once | INTRAVENOUS | Status: AC | PRN
  Administered 2022-01-17: 7 mL via INTRAVENOUS

## 2022-01-28 DIAGNOSIS — N138 Other obstructive and reflux uropathy: Secondary | ICD-10-CM | POA: Diagnosis not present

## 2022-01-28 DIAGNOSIS — R972 Elevated prostate specific antigen [PSA]: Secondary | ICD-10-CM | POA: Diagnosis not present

## 2022-01-28 DIAGNOSIS — N401 Enlarged prostate with lower urinary tract symptoms: Secondary | ICD-10-CM | POA: Diagnosis not present

## 2022-03-03 DIAGNOSIS — R21 Rash and other nonspecific skin eruption: Secondary | ICD-10-CM | POA: Diagnosis not present

## 2022-03-03 DIAGNOSIS — B0221 Postherpetic geniculate ganglionitis: Secondary | ICD-10-CM | POA: Diagnosis not present

## 2022-03-03 DIAGNOSIS — E782 Mixed hyperlipidemia: Secondary | ICD-10-CM | POA: Diagnosis not present

## 2022-03-03 DIAGNOSIS — Z113 Encounter for screening for infections with a predominantly sexual mode of transmission: Secondary | ICD-10-CM | POA: Diagnosis not present

## 2022-03-03 DIAGNOSIS — E119 Type 2 diabetes mellitus without complications: Secondary | ICD-10-CM | POA: Diagnosis not present

## 2022-03-05 ENCOUNTER — Encounter (HOSPITAL_COMMUNITY): Payer: Self-pay | Admitting: *Deleted

## 2022-03-05 ENCOUNTER — Ambulatory Visit (HOSPITAL_COMMUNITY)
Admission: EM | Admit: 2022-03-05 | Discharge: 2022-03-05 | Disposition: A | Discharge status: Home / Self Care | Payer: BC Managed Care – PPO | Attending: Student | Admitting: Student

## 2022-03-05 ENCOUNTER — Other Ambulatory Visit: Payer: Self-pay

## 2022-03-05 DIAGNOSIS — H7291 Unspecified perforation of tympanic membrane, right ear: Secondary | ICD-10-CM | POA: Diagnosis not present

## 2022-03-05 DIAGNOSIS — B028 Zoster with other complications: Secondary | ICD-10-CM

## 2022-03-05 MED ORDER — OFLOXACIN 0.3 % OT SOLN: 3.0000 [drp] | Freq: Two times a day (BID) | OTIC | 0 refills | Status: AC

## 2022-03-05 MED ORDER — CEPHALEXIN 500 MG PO CAPS: 500.0000 mg | ORAL_CAPSULE | Freq: Four times a day (QID) | ORAL | 0 refills | Status: AC

## 2022-03-05 MED ORDER — PREDNISONE 50 MG PO TABS: 50.0000 mg | ORAL_TABLET | Freq: Every day | ORAL | 0 refills | Status: AC

## 2022-03-05 NOTE — ED Provider Notes (Signed)
[facility]    CSN: [id number] Arrival date & time: 03/05/22  1441      History   Chief Complaint Chief Complaint  Patient presents with   Herpes Zoster    HPI Gabriel Soto is a 46 y.o. male presenting with herpes zoster x1 week.  History noncontributory.  He states that he was seen at [facility] on 6/29 for this, he was not seen by an ENT physician.  He states that the rash developed first, and was present at his visit on 6/29, he was prescribed Valtrex bid (?) x10 days, which he is taking as directed.  He states he noticed onset of right ear pain with blood on his pillow this morning, which concerned him greatly.  Also with some hearing loss on that side.  He is feeling well otherwise, without recent cough congestion or fevers.  Denies facial paralysis.  Denies eye pain, vision changes. Has not been seen by an ENT; has not contacted an ENT.   HPI  Past Medical History:  Diagnosis Date   Hypertension    Polycystic kidney disease     There are no problems to display for this patient.   History reviewed. No pertinent surgical history.     Home Medications    Prior to Admission medications   Medication Sig Start Date End Date Taking? Authorizing Provider  cephALEXin (KEFLEX) 500 MG capsule Take 1 capsule (500 mg total) by mouth 4 (four) times daily. 03/05/22  Yes Rhys Martini, PA-C  ofloxacin (FLOXIN) 0.3 % OTIC solution Place 3 drops into the right ear 2 (two) times daily. 03/05/22  Yes Rhys Martini, PA-C  predniSONE (DELTASONE) 50 MG tablet Take 1 tablet (50 mg total) by mouth daily for 5 days. Take with breakfast or lunch. Avoid NSAIDs (ibuprofen, etc) while taking this medication. 03/05/22 03/10/22 Yes Rhys Martini, PA-C  enalapril (VASOTEC) 10 MG tablet Take 10 mg by mouth at bedtime.    [provider]  enalapril (VASOTEC) 10 MG tablet Take 1 tablet (10 mg total) by mouth daily. 07/15/16   Danelle Berry, PA-C  omeprazole (PRILOSEC) 20 MG  capsule Take 1 capsule (20 mg total) by mouth daily. 07/15/16   Danelle Berry, PA-C    Family History History reviewed. No pertinent family history.  Social History Social History   Tobacco Use   Smoking status: Never   Smokeless tobacco: Never  Substance Use Topics   Alcohol use: No   Drug use: No     Allergies   Shrimp [shellfish allergy]   Review of Systems Review of Systems  HENT:  Positive for ear discharge and ear pain.   Skin:  Positive for rash.  All other systems reviewed and are negative.    Physical Exam Triage Vital Signs ED Triage Vitals  Enc Vitals Group     BP 03/05/22 1611 (!) 139/93     Pulse Rate 03/05/22 1611 92     Resp 03/05/22 1611 18     Temp 03/05/22 1611 100.1 F (37.8 C)     Temp src --      SpO2 03/05/22 1611 97 %     Weight --      Height --      Head Circumference --      Peak Flow --      Pain Score 03/05/22 1609 8     Pain Loc --      Pain Edu? --  Excl. in GC? --    No data found.  Updated Vital Signs BP (!) 139/93   Pulse 92   Temp 100.1 F (37.8 C)   Resp 18   SpO2 97%   Visual Acuity Right Eye Distance:   Left Eye Distance:   Bilateral Distance:    Right Eye Near:   Left Eye Near:    Bilateral Near:     Physical Exam Vitals reviewed.  Constitutional:      General: He is not in acute distress.    Appearance: Normal appearance. He is not ill-appearing.  HENT:     Head: Atraumatic.     Right Ear: Swelling and tenderness present. Tympanic membrane is perforated.     Left Ear: Hearing, tympanic membrane, ear canal and external ear normal. No swelling or tenderness.  No middle ear effusion. There is no impacted cerumen. No mastoid tenderness. Tympanic membrane is not injected, scarred, perforated, erythematous, retracted or bulging.     Ears:     Comments: R canal is very swollen. TM appears perforated, though I am unable to visualize the entire TM.    Mouth/Throat:     Pharynx: Oropharynx is clear. No  oropharyngeal exudate or posterior oropharyngeal erythema.     Comments: No rash or lesion inside the mouth   Cardiovascular:     Rate and Rhythm: Normal rate and regular rhythm.     Heart sounds: Normal heart sounds.  Pulmonary:     Effort: Pulmonary effort is normal.     Breath sounds: Normal breath sounds.  Lymphadenopathy:     Cervical: No cervical adenopathy.  Skin:    Comments: See image below Vesicular rash R chin extending towards the ear. Rash does not extend to the eye or eyelid. There are pustules throughout.   Neurological:     General: No focal deficit present.     Mental Status: He is alert and oriented to person, place, and time.  Psychiatric:        Mood and Affect: Mood normal.        Behavior: Behavior normal.        Thought Content: Thought content normal.        Judgment: Judgment normal.        UC Treatments / Results  Labs (all labs ordered are listed, but only abnormal results are displayed) Labs Reviewed - No data to display  EKG   Radiology No results found.  Procedures Procedures (including critical care time)  Medications Ordered in UC Medications - No data to display  Initial Impression / Assessment and Plan / UC Course  I have reviewed the triage vital signs and the nursing notes.  Pertinent labs & imaging results that were available during my care of the patient were reviewed by me and considered in my medical decision making (see chart for details).     This patient is a very pleasant 46 y.o. year old male presenting with herpes zoster of the face. Borderline febrile but nontachy. Rash does not extend to the eye. The R TM is perforated; suspect this is sequela of the herpes zoster. He is already taking valtrex bid (?) x10 days. Will start him on prednisone 50mg  qd, keflex, and ofloxacin otic drops. Clean dry ear precautions.  Discussed possible sequelae including ocular involvement and Ramsay Hunt syndrome.  He understands to present  to the emergency department if symptoms are still progressing in 24 hours, or sooner if ocular symptoms or facial paralysis.  If symptoms are stable, he can call ENT next business day on 7/3.  He is in agreement.   Final Clinical Impressions(s) / UC Diagnoses   Final diagnoses:  Herpes zoster with other complication  Perforation of right tympanic membrane     Discharge Instructions      -Complete the valtrex as directed -Prednisone 1 pill daily for 5 days. Take earlier in the day as it can give you energey. Avoid nsaids like ibuprofen while you take this medication.  -Start the antibiotic: Keflex, 4x daily x5 days. You can take this with food if you have a sensitive stomach. -Ofloxacin drops twice daily R ear -Keep the R ear dry - use a cotton ball or earplug while in the shower  -if your symptoms are still getting worse tomorrow, head to the ED.  -If symptoms persist on Monday 7/3, call ENT. Information below.  -In rare circumstances, the herpes zoster virus can target the nerve in your face.  If you have any new facial paralysis or abnormal facial movements, head to the emergency department.   ED Prescriptions     Medication Sig Dispense Auth. Provider   predniSONE (DELTASONE) 50 MG tablet Take 1 tablet (50 mg total) by mouth daily for 5 days. Take with breakfast or lunch. Avoid NSAIDs (ibuprofen, etc) while taking this medication. 5 tablet Rhys Martini, PA-C   cephALEXin (KEFLEX) 500 MG capsule Take 1 capsule (500 mg total) by mouth 4 (four) times daily. 20 capsule Rhys Martini, PA-C   ofloxacin (FLOXIN) 0.3 % OTIC solution Place 3 drops into the right ear 2 (two) times daily. 5 mL Rhys Martini, PA-C      PDMP not reviewed this encounter.   Rhys Martini, PA-C 03/05/22 1652

## 2022-03-05 NOTE — ED Triage Notes (Signed)
Pt was seen and treated by ENT this week and started on Ati-bx. Pt has areas on Rt side of face extending up toward Rt ear. Pt reports he may have a open sore in his ear. Pt has had drainage from Rt ear. Pt is worried he may have hearing damage due to sore in his ear.Pt has meds with him that the ENT started him on.

## 2022-03-05 NOTE — Discharge Instructions (Signed)
-  Complete the valtrex as directed -Prednisone 1 pill daily for 5 days. Take earlier in the day as it can give you energey. Avoid nsaids like ibuprofen while you take this medication.  -Start the antibiotic: Keflex, 4x daily x5 days. You can take this with food if you have a sensitive stomach. -Ofloxacin drops twice daily R ear -Keep the R ear dry - use a cotton ball or earplug while in the shower  -if your symptoms are still getting worse tomorrow, head to the ED.  -If symptoms persist on Monday 7/3, call ENT. Information below.  -In rare circumstances, the herpes zoster virus can target the nerve in your face.  If you have any new facial paralysis or abnormal facial movements, head to the emergency department.

## 2022-03-07 DIAGNOSIS — R799 Abnormal finding of blood chemistry, unspecified: Secondary | ICD-10-CM | POA: Diagnosis not present

## 2022-03-07 DIAGNOSIS — Z719 Counseling, unspecified: Secondary | ICD-10-CM | POA: Diagnosis not present

## 2022-03-11 DIAGNOSIS — B028 Zoster with other complications: Secondary | ICD-10-CM | POA: Diagnosis not present

## 2022-03-16 DIAGNOSIS — Q613 Polycystic kidney, unspecified: Secondary | ICD-10-CM | POA: Diagnosis not present

## 2022-03-16 DIAGNOSIS — I129 Hypertensive chronic kidney disease with stage 1 through stage 4 chronic kidney disease, or unspecified chronic kidney disease: Secondary | ICD-10-CM | POA: Diagnosis not present

## 2022-03-16 DIAGNOSIS — E1122 Type 2 diabetes mellitus with diabetic chronic kidney disease: Secondary | ICD-10-CM | POA: Diagnosis not present

## 2022-03-16 DIAGNOSIS — Z79899 Other long term (current) drug therapy: Secondary | ICD-10-CM | POA: Diagnosis not present

## 2022-03-16 DIAGNOSIS — Z7984 Long term (current) use of oral hypoglycemic drugs: Secondary | ICD-10-CM | POA: Diagnosis not present

## 2022-03-16 DIAGNOSIS — N182 Chronic kidney disease, stage 2 (mild): Secondary | ICD-10-CM | POA: Diagnosis not present

## 2022-03-28 DIAGNOSIS — B0222 Postherpetic trigeminal neuralgia: Secondary | ICD-10-CM | POA: Diagnosis not present
